# Patient Record
Sex: Female | Born: 1960 | Race: Black or African American | Hispanic: No | Marital: Single
Health system: Southern US, Community
[De-identification: ages and names within clinical notes are randomized; demographics above are authoritative.]

## PROBLEM LIST (undated history)

## (undated) DIAGNOSIS — I10 Essential (primary) hypertension: Secondary | ICD-10-CM

## (undated) HISTORY — PX: SHOULDER SURGERY: SHX246

## (undated) HISTORY — PX: BACK SURGERY: SHX140

---

## 2018-01-03 ENCOUNTER — Emergency Department (HOSPITAL_COMMUNITY): Payer: No Typology Code available for payment source

## 2018-01-03 ENCOUNTER — Emergency Department (HOSPITAL_COMMUNITY)
Admission: EM | Admit: 2018-01-03 | Discharge: 2018-01-03 | Disposition: A | Discharge status: Home / Self Care | Payer: No Typology Code available for payment source | Attending: Emergency Medicine | Admitting: Emergency Medicine

## 2018-01-03 ENCOUNTER — Other Ambulatory Visit: Payer: Self-pay

## 2018-01-03 ENCOUNTER — Encounter (HOSPITAL_COMMUNITY): Payer: Self-pay | Admitting: Emergency Medicine

## 2018-01-03 DIAGNOSIS — R51 Headache: Secondary | ICD-10-CM | POA: Insufficient documentation

## 2018-01-03 DIAGNOSIS — I10 Essential (primary) hypertension: Secondary | ICD-10-CM | POA: Insufficient documentation

## 2018-01-03 DIAGNOSIS — M79602 Pain in left arm: Secondary | ICD-10-CM | POA: Insufficient documentation

## 2018-01-03 DIAGNOSIS — R0789 Other chest pain: Secondary | ICD-10-CM | POA: Diagnosis not present

## 2018-01-03 DIAGNOSIS — M542 Cervicalgia: Secondary | ICD-10-CM | POA: Diagnosis not present

## 2018-01-03 DIAGNOSIS — M549 Dorsalgia, unspecified: Secondary | ICD-10-CM | POA: Diagnosis not present

## 2018-01-03 HISTORY — DX: Essential (primary) hypertension: I10

## 2018-01-03 MED ORDER — METHOCARBAMOL 500 MG PO TABS
500.0000 mg | ORAL_TABLET | Freq: Once | ORAL | Status: DC
  Filled 2018-01-03: qty 1

## 2018-01-03 MED ORDER — METHOCARBAMOL 500 MG PO TABS: 500.0000 mg | ORAL_TABLET | Freq: Three times a day (TID) | ORAL | 0 refills | Status: DC | PRN

## 2018-01-03 MED ORDER — OXYCODONE-ACETAMINOPHEN 5-325 MG PO TABS
1.0000 | ORAL_TABLET | Freq: Once | ORAL | Status: AC
  Administered 2018-01-03: 1 via ORAL
  Filled 2018-01-03: qty 1

## 2018-01-03 MED ORDER — CYCLOBENZAPRINE HCL 5 MG PO TABS: 5.0000 mg | ORAL_TABLET | Freq: Three times a day (TID) | ORAL | 0 refills | Status: AC | PRN
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
